# Patient Record
Sex: Male | Born: 1970 | Race: White | Hispanic: No | Marital: Married | Smoking: Former smoker
Health system: Southern US, Community
[De-identification: ages and names within clinical notes are randomized; demographics above are authoritative.]

## PROBLEM LIST (undated history)

## (undated) DIAGNOSIS — M1990 Unspecified osteoarthritis, unspecified site: Secondary | ICD-10-CM

## (undated) DIAGNOSIS — M549 Dorsalgia, unspecified: Secondary | ICD-10-CM

## (undated) DIAGNOSIS — K219 Gastro-esophageal reflux disease without esophagitis: Secondary | ICD-10-CM

## (undated) DIAGNOSIS — E7800 Pure hypercholesterolemia, unspecified: Secondary | ICD-10-CM

## (undated) HISTORY — PX: EYE SURGERY: SHX253

---

## 2012-04-17 ENCOUNTER — Emergency Department (HOSPITAL_BASED_OUTPATIENT_CLINIC_OR_DEPARTMENT_OTHER)

## 2012-04-17 ENCOUNTER — Emergency Department (HOSPITAL_BASED_OUTPATIENT_CLINIC_OR_DEPARTMENT_OTHER)
Admission: EM | Admit: 2012-04-17 | Discharge: 2012-04-17 | Disposition: A | Discharge status: Home / Self Care | Attending: Emergency Medicine | Admitting: Emergency Medicine

## 2012-04-17 ENCOUNTER — Encounter (HOSPITAL_BASED_OUTPATIENT_CLINIC_OR_DEPARTMENT_OTHER): Payer: Self-pay | Admitting: *Deleted

## 2012-04-17 DIAGNOSIS — R079 Chest pain, unspecified: Secondary | ICD-10-CM | POA: Insufficient documentation

## 2012-04-17 DIAGNOSIS — Z8739 Personal history of other diseases of the musculoskeletal system and connective tissue: Secondary | ICD-10-CM | POA: Insufficient documentation

## 2012-04-17 DIAGNOSIS — K219 Gastro-esophageal reflux disease without esophagitis: Secondary | ICD-10-CM | POA: Insufficient documentation

## 2012-04-17 DIAGNOSIS — R1310 Dysphagia, unspecified: Secondary | ICD-10-CM

## 2012-04-17 HISTORY — DX: Unspecified osteoarthritis, unspecified site: M19.90

## 2012-04-17 HISTORY — DX: Dorsalgia, unspecified: M54.9

## 2012-04-17 HISTORY — DX: Gastro-esophageal reflux disease without esophagitis: K21.9

## 2012-04-17 LAB — COMPREHENSIVE METABOLIC PANEL
Albumin: 3.8 g/dL (ref 3.5–5.2)
Alkaline Phosphatase: 45 U/L (ref 39–117)
BUN: 13 mg/dL (ref 6–23)
Potassium: 3.6 mEq/L (ref 3.5–5.1)
Total Protein: 7.1 g/dL (ref 6.0–8.3)

## 2012-04-17 LAB — CBC WITH DIFFERENTIAL/PLATELET
Basophils Relative: 1 % (ref 0–1)
Eosinophils Absolute: 0.2 10*3/uL (ref 0.0–0.7)
Hemoglobin: 14.2 g/dL (ref 13.0–17.0)
MCH: 32 pg (ref 26.0–34.0)
MCHC: 36.2 g/dL — ABNORMAL HIGH (ref 30.0–36.0)
Monocytes Relative: 10 % (ref 3–12)
Neutrophils Relative %: 46 % (ref 43–77)
Platelets: 201 10*3/uL (ref 150–400)

## 2012-04-17 LAB — TROPONIN I: Troponin I: <0.3 ng/mL (ref <0.30)

## 2012-04-17 MED ORDER — GI COCKTAIL ~~LOC~~
30.0000 mL | Freq: Once | ORAL | Status: AC
  Administered 2012-04-17: 30 mL via ORAL
  Filled 2012-04-17: qty 30

## 2012-04-17 MED ORDER — PANTOPRAZOLE SODIUM 20 MG PO TBEC: 20.0000 mg | DELAYED_RELEASE_TABLET | Freq: Two times a day (BID) | ORAL | Status: DC

## 2012-04-17 NOTE — ED Notes (Signed)
Pt reports chest pain since yesterday morning- states pain is pressure in mid-chest- worse with swallowing

## 2012-04-17 NOTE — ED Provider Notes (Signed)
History     CSN: [id number]  Arrival date & time 04/17/12  2146   First MD Initiated Contact with Patient 04/17/12 2255      Chief Complaint  Patient presents with  . Chest Pain    (Consider location/radiation/quality/duration/timing/severity/associated sxs/prior treatment) Patient is a 41 y.o. male presenting with chest pain. The history is provided by the patient.  Chest Pain The chest pain began 2 days ago. Chest pain occurs intermittently. The chest pain is worsening. Associated with: eating. The severity of the pain is moderate. The quality of the pain is described as burning. The pain does not radiate. Chest pain is worsened by eating. Primary symptoms include nausea. Pertinent negatives for primary symptoms include no fever, no shortness of breath, no cough and no palpitations.     Past Medical History  Diagnosis Date  . Arthritis   . Acid reflux   . Back pain     Past Surgical History  Procedure Date  . Eye surgery     No family history on file.  History  Substance Use Topics  . Smoking status: Former Games developer  . Smokeless tobacco: Never Used  . Alcohol Use: Not on file      Review of Systems  Constitutional: Negative for fever.  Respiratory: Negative for cough and shortness of breath.   Cardiovascular: Positive for chest pain. Negative for palpitations.  Gastrointestinal: Positive for nausea.  All other systems reviewed and are negative.    Allergies  Review of patient's allergies indicates no known allergies.  Home Medications   Current Outpatient Rx  Name Route Sig Dispense Refill  . DOXYCYCLINE HYCLATE PO Oral Take 1 tablet by mouth daily. For tooth pain.    . MOMETASONE FUROATE 220 MCG/INH IN AEPB Inhalation Inhale 2 puffs into the lungs daily.    Marland Kitchen OMEPRAZOLE 20 MG PO CPDR Oral Take 20 mg by mouth daily.    Marland Kitchen PRESCRIPTION MEDICATION Oral Take 1 tablet by mouth 3 (three) times daily. Muscle relaxer.      BP 127/84  Pulse 77  Temp 98.1 F  (36.7 C) (Oral)  Resp 19  SpO2 100%  Physical Exam  Nursing note and vitals reviewed. Constitutional: He is oriented to person, place, and time. He appears well-developed and well-nourished. No distress.  HENT:  Head: Normocephalic and atraumatic.  Mouth/Throat: Oropharynx is clear and moist.  Neck: Normal range of motion. Neck supple.  Cardiovascular: Normal rate and regular rhythm.   No murmur heard. Pulmonary/Chest: Breath sounds normal. No respiratory distress. He has no wheezes.  Abdominal: Soft. Bowel sounds are normal. He exhibits no distension. There is no tenderness.  Musculoskeletal: Normal range of motion. He exhibits no edema.  Neurological: He is alert and oriented to person, place, and time.  Skin: Skin is warm and dry. He is not diaphoretic.    ED Course  Procedures (including critical care time)  Labs Reviewed  CBC WITH DIFFERENTIAL - Abnormal; Notable for the following:    MCHC 36.2 (*)     All other components within normal limits  COMPREHENSIVE METABOLIC PANEL - Abnormal; Notable for the following:    Glucose, Bld 102 (*)     AST 43 (*)     ALT 91 (*)     GFR calc non Af Amer 82 (*)     All other components within normal limits  TROPONIN I   Dg Chest 2 View  04/17/2012  *RADIOLOGY REPORT*  Clinical Data: Palpitations and anxiety  CHEST -  2 VIEW  Comparison:  None.  Findings:  The heart size and mediastinal contours are within normal limits.  Both lungs are clear.  The visualized skeletal structures are unremarkable.  IMPRESSION: No active cardiopulmonary disease.   Original Report Authenticated By: Camelia Phenes, M.D.      No diagnosis found.   Date: 04/17/2012  Rate: 78  Rhythm: normal sinus rhythm  QRS Axis: normal  Intervals: normal  ST/T Wave abnormalities: normal  Conduction Disutrbances:none  Narrative Interpretation:   Old EKG Reviewed: none available    MDM  The patient presents here with symptoms that sound gi in nature.  He is  feeling better with the gi cocktail.  There is nothing in the history, exam, or workup to suggest a cardiac etiology.  He is on prilosec and I will change this to protonix and see how he responds to this.  He should follow up with his pcp in the upcoming week or two to discuss further testing, for example endoscopy, at his discretion.          Geoffery Lyons, MD 04/17/12 [id number]

## 2013-09-12 ENCOUNTER — Encounter (HOSPITAL_COMMUNITY): Payer: Self-pay | Admitting: Emergency Medicine

## 2013-09-12 ENCOUNTER — Emergency Department (HOSPITAL_COMMUNITY)
Admission: EM | Admit: 2013-09-12 | Discharge: 2013-09-12 | Disposition: A | Discharge status: Home / Self Care | Attending: Emergency Medicine | Admitting: Emergency Medicine

## 2013-09-12 DIAGNOSIS — K2970 Gastritis, unspecified, without bleeding: Secondary | ICD-10-CM

## 2013-09-12 DIAGNOSIS — Z8739 Personal history of other diseases of the musculoskeletal system and connective tissue: Secondary | ICD-10-CM | POA: Insufficient documentation

## 2013-09-12 DIAGNOSIS — Z8639 Personal history of other endocrine, nutritional and metabolic disease: Secondary | ICD-10-CM | POA: Insufficient documentation

## 2013-09-12 DIAGNOSIS — Z862 Personal history of diseases of the blood and blood-forming organs and certain disorders involving the immune mechanism: Secondary | ICD-10-CM | POA: Insufficient documentation

## 2013-09-12 DIAGNOSIS — R197 Diarrhea, unspecified: Secondary | ICD-10-CM

## 2013-09-12 DIAGNOSIS — Z87891 Personal history of nicotine dependence: Secondary | ICD-10-CM | POA: Insufficient documentation

## 2013-09-12 DIAGNOSIS — Z79899 Other long term (current) drug therapy: Secondary | ICD-10-CM | POA: Insufficient documentation

## 2013-09-12 DIAGNOSIS — R000 Tachycardia, unspecified: Secondary | ICD-10-CM | POA: Insufficient documentation

## 2013-09-12 DIAGNOSIS — K219 Gastro-esophageal reflux disease without esophagitis: Secondary | ICD-10-CM | POA: Insufficient documentation

## 2013-09-12 DIAGNOSIS — IMO0002 Reserved for concepts with insufficient information to code with codable children: Secondary | ICD-10-CM | POA: Insufficient documentation

## 2013-09-12 DIAGNOSIS — K2990 Gastroduodenitis, unspecified, without bleeding: Principal | ICD-10-CM

## 2013-09-12 HISTORY — DX: Pure hypercholesterolemia, unspecified: E78.00

## 2013-09-12 LAB — CBC WITH DIFFERENTIAL/PLATELET
Basophils Absolute: 0 K/uL (ref 0.0–0.1)
Basophils Relative: 0 % (ref 0–1)
Eosinophils Absolute: 0 K/uL (ref 0.0–0.7)
Eosinophils Relative: 0 % (ref 0–5)
HCT: 50.4 % (ref 39.0–52.0)
Hemoglobin: 18.7 g/dL — ABNORMAL HIGH (ref 13.0–17.0)
Lymphocytes Relative: 7 % — ABNORMAL LOW (ref 12–46)
Lymphs Abs: 0.8 K/uL (ref 0.7–4.0)
MCH: 32.4 pg (ref 26.0–34.0)
MCHC: 37 g/dL — ABNORMAL HIGH (ref 30.0–36.0)
MCV: 87.3 fL (ref 78.0–100.0)
Monocytes Absolute: 0.5 K/uL (ref 0.1–1.0)
Monocytes Relative: 4 % (ref 3–12)
Neutro Abs: 9.2 K/uL — ABNORMAL HIGH (ref 1.7–7.7)
Neutrophils Relative %: 88 % — ABNORMAL HIGH (ref 43–77)
Platelets: 241 K/uL (ref 150–400)
RBC: 5.77 MIL/uL (ref 4.22–5.81)
RDW: 12.5 % (ref 11.5–15.5)
WBC: 10.5 K/uL (ref 4.0–10.5)

## 2013-09-12 LAB — COMPREHENSIVE METABOLIC PANEL WITH GFR
ALT: 112 U/L — ABNORMAL HIGH (ref 0–53)
AST: 36 U/L (ref 0–37)
Albumin: 4.5 g/dL (ref 3.5–5.2)
Alkaline Phosphatase: 48 U/L (ref 39–117)
BUN: 18 mg/dL (ref 6–23)
CO2: 21 meq/L (ref 19–32)
Calcium: 11.3 mg/dL — ABNORMAL HIGH (ref 8.4–10.5)
Chloride: 97 meq/L (ref 96–112)
Creatinine, Ser: 0.96 mg/dL (ref 0.50–1.35)
GFR calc Af Amer: >90 mL/min
GFR calc non Af Amer: >90 mL/min
Glucose, Bld: 132 mg/dL — ABNORMAL HIGH (ref 70–99)
Potassium: 3.9 meq/L (ref 3.7–5.3)
Sodium: 139 meq/L (ref 137–147)
Total Bilirubin: 0.7 mg/dL (ref 0.3–1.2)
Total Protein: 8.9 g/dL — ABNORMAL HIGH (ref 6.0–8.3)

## 2013-09-12 LAB — LIPASE, BLOOD: Lipase: 20 U/L (ref 11–59)

## 2013-09-12 MED ORDER — GI COCKTAIL ~~LOC~~
30.0000 mL | Freq: Once | ORAL | Status: AC
  Administered 2013-09-12: 30 mL via ORAL
  Filled 2013-09-12: qty 30

## 2013-09-12 MED ORDER — ONDANSETRON 4 MG PO TBDP: 4.0000 mg | ORAL_TABLET | Freq: Three times a day (TID) | ORAL | Status: DC | PRN

## 2013-09-12 MED ORDER — SODIUM CHLORIDE 0.9 % IV BOLUS (SEPSIS)
1000.0000 mL | Freq: Once | INTRAVENOUS | Status: AC
  Administered 2013-09-12: 1000 mL via INTRAVENOUS

## 2013-09-12 MED ORDER — PROMETHAZINE HCL 25 MG RE SUPP: 25.0000 mg | Freq: Four times a day (QID) | RECTAL | Status: DC | PRN

## 2013-09-12 MED ORDER — HYDROCODONE-ACETAMINOPHEN 5-325 MG PO TABS: 1.0000 | ORAL_TABLET | ORAL | Status: DC | PRN

## 2013-09-12 MED ORDER — MORPHINE SULFATE 4 MG/ML IJ SOLN
4.0000 mg | Freq: Once | INTRAMUSCULAR | Status: AC
  Administered 2013-09-12: 4 mg via INTRAVENOUS
  Filled 2013-09-12: qty 1

## 2013-09-12 MED ORDER — ONDANSETRON HCL 4 MG/2ML IJ SOLN
4.0000 mg | Freq: Once | INTRAMUSCULAR | Status: AC
  Administered 2013-09-12: 4 mg via INTRAVENOUS
  Filled 2013-09-12: qty 2

## 2013-09-12 NOTE — Discharge Instructions (Signed)
Call for a follow up appointment with a Family or Primary Care Provider.  °Return if Symptoms worsen.   °Take medication as prescribed.  ° °

## 2013-09-12 NOTE — ED Notes (Signed)
abd pain and nausea x 3 days and the n/v virus ahs been going around his house pt has had diarrhea

## 2013-09-12 NOTE — ED Notes (Signed)
ED PA at bedside

## 2013-09-12 NOTE — ED Provider Notes (Signed)
CSN: [id number]     Arrival date & time 09/12/13  1118 History   First MD Initiated Contact with Patient 09/12/13 1206     Chief Complaint  Patient presents with  . Nausea   (Consider location/radiation/quality/duration/timing/severity/associated sxs/prior Treatment) HPI Comments: Evan Barber is a 43 y.o. year-old male with a past medical history of GERD, back pain, presenting the Emergency Department with a chief complaint of abdominal pain since 2300 last night.  He reports 10/10 generalized abdominal pain. He reports associated nausea without emesis but reports foul after taste from belching and reflux symptoms. The patient report 10-11 episodes of watery diarrhea since 0100 today. He reports taking omeprazole, pepto bismol, and tums without relief.  No history of abdominal surgeries or colonoscopy.  No EtOH use.     The history is provided by the patient, medical records and a significant other. No language interpreter was used.    Past Medical History  Diagnosis Date  . Arthritis   . Acid reflux   . Back pain   . Acid reflux   . High cholesterol    Past Surgical History  Procedure Laterality Date  . Eye surgery     No family history on file. History  Substance Use Topics  . Smoking status: Former Games developermoker  . Smokeless tobacco: Never Used  . Alcohol Use: Not on file    Review of Systems  Constitutional: Negative for fever and chills.  Cardiovascular: Negative for chest pain and leg swelling.  Gastrointestinal: Positive for nausea, abdominal pain and diarrhea. Negative for vomiting, constipation, blood in stool, abdominal distention and anal bleeding.    Allergies  Review of patient's allergies indicates no known allergies.  Home Medications   Current Outpatient Rx  Name  Route  Sig  Dispense  Refill  . bismuth subsalicylate (PEPTO BISMOL) 262 MG/15ML suspension   Oral   Take 30 mLs by mouth every 6 (six) hours as needed for indigestion.         . calcium  carbonate (TUMS - DOSED IN MG ELEMENTAL CALCIUM) 500 MG chewable tablet   Oral   Chew 3 tablets by mouth daily.         . citalopram (CELEXA) 20 MG tablet   Oral   Take 20 mg by mouth daily.         . mometasone (ASMANEX) 220 MCG/INH inhaler   Inhalation   Inhale 2 puffs into the lungs daily.         Marland Kitchen. omeprazole (PRILOSEC) 20 MG capsule   Oral   Take 20 mg by mouth daily.         Marland Kitchen. EXPIRED: pantoprazole (PROTONIX) 20 MG tablet   Oral   Take 1 tablet (20 mg total) by mouth 2 (two) times daily.   30 tablet   1    BP 124/62  Pulse 91  Temp(Src) 97.6 F (36.4 C)  Resp 16  SpO2 99% Physical Exam  Nursing note and vitals reviewed. Constitutional: He is oriented to person, place, and time. He appears well-developed and well-nourished. No distress.  HENT:  Head: Normocephalic and atraumatic.  Eyes: No scleral icterus.  Neck: Neck supple.  Cardiovascular: Regular rhythm.  Tachycardia present.   Pulmonary/Chest: Effort normal. Not tachypneic. No respiratory distress. He has no decreased breath sounds. He has no wheezes. He has no rhonchi. He has no rales.  Abdominal: Soft. Normal appearance and bowel sounds are normal. There is tenderness in the right upper quadrant, epigastric area, left  upper quadrant and left lower quadrant. There is no CVA tenderness, no tenderness at McBurney's point and negative Murphy's sign.  Neurological: He is alert and oriented to person, place, and time.    ED Course  Procedures (including critical care time) Labs Review Labs Reviewed  CBC WITH DIFFERENTIAL - Abnormal; Notable for the following:    Hemoglobin 18.7 (*)    MCHC 37.0 (*)    Neutrophils Relative % 88 (*)    Neutro Abs 9.2 (*)    Lymphocytes Relative 7 (*)    All other components within normal limits  COMPREHENSIVE METABOLIC PANEL - Abnormal; Notable for the following:    Glucose, Bld 132 (*)    Calcium 11.3 (*)    Total Protein 8.9 (*)    ALT 112 (*)    All other  components within normal limits  LIPASE, BLOOD   Imaging Review No results found.  EKG Interpretation   None       MDM   1. Gastritis   2. Diarrhea    Pt reports abrupt onset of abdominal pain last night with numerous episodes of diarrhea, several family members with a GI symptoms for several days.  Likely viral illness, labs, fluid, pain medication, nausea medication ordered. Lipase, negative. CBC WNL, CBC elevated Hgb. 1400: Patient sleeping in room reports pain is 4/10 and nausea symptoms have resolved.  1801: Re-eval patient reports symptom improvement, abdomen non-tender to palpation.  Discussed lab results, imaging results, and treatment plan with the patient and the patient's wife. Return precautions given. Reports understanding and no other concerns at this time.  Patient is stable for discharge at this time.  Meds given in ED:  Medications  sodium chloride 0.9 % bolus 1,000 mL (0 mLs Intravenous Stopped 09/12/13 1518)  ondansetron (ZOFRAN) injection 4 mg (4 mg Intravenous Given 09/12/13 1256)  morphine 4 MG/ML injection 4 mg (4 mg Intravenous Given 09/12/13 1256)  gi cocktail (Maalox,Lidocaine,Donnatal) (30 mLs Oral Given 09/12/13 1518)    Discharge Medication List as of 09/12/2013  4:07 PM    START taking these medications   Details  HYDROcodone-acetaminophen (NORCO/VICODIN) 5-325 MG per tablet Take 1 tablet by mouth every 4 (four) hours as needed. Take with food, Starting 09/12/2013, Until Discontinued, Print    ondansetron (ZOFRAN ODT) 4 MG disintegrating tablet Take 1 tablet (4 mg total) by mouth every 8 (eight) hours as needed for nausea or vomiting., Starting 09/12/2013, Until Discontinued, Print    promethazine (PHENERGAN) 25 MG suppository Place 1 suppository (25 mg total) rectally every 6 (six) hours as needed for nausea or vomiting., Starting 09/12/2013, Until Discontinued, Print          Clabe Seal, PA-C 09/15/13 1523

## 2013-09-19 NOTE — ED Provider Notes (Signed)
Medical screening examination/treatment/procedure(s) were performed by non-physician practitioner and as supervising physician I was immediately available for consultation/collaboration.  EKG Interpretation   None         Shelda JakesScott W. Amiel Mccaffrey, MD 09/19/13 1319

## 2016-11-02 ENCOUNTER — Emergency Department (HOSPITAL_COMMUNITY)

## 2016-11-02 ENCOUNTER — Encounter (HOSPITAL_COMMUNITY): Payer: Self-pay | Admitting: Emergency Medicine

## 2016-11-02 ENCOUNTER — Emergency Department (HOSPITAL_COMMUNITY)
Admission: EM | Admit: 2016-11-02 | Discharge: 2016-11-03 | Disposition: A | Discharge status: Home / Self Care | Attending: Emergency Medicine | Admitting: Emergency Medicine

## 2016-11-02 DIAGNOSIS — Z87891 Personal history of nicotine dependence: Secondary | ICD-10-CM | POA: Insufficient documentation

## 2016-11-02 DIAGNOSIS — R109 Unspecified abdominal pain: Secondary | ICD-10-CM | POA: Diagnosis present

## 2016-11-02 DIAGNOSIS — N23 Unspecified renal colic: Secondary | ICD-10-CM | POA: Diagnosis not present

## 2016-11-02 DIAGNOSIS — N201 Calculus of ureter: Secondary | ICD-10-CM | POA: Diagnosis not present

## 2016-11-02 DIAGNOSIS — N200 Calculus of kidney: Secondary | ICD-10-CM

## 2016-11-02 LAB — CBC
HEMATOCRIT: 44.4 % (ref 39.0–52.0)
HEMOGLOBIN: 15.4 g/dL (ref 13.0–17.0)
MCH: 31.2 pg (ref 26.0–34.0)
MCHC: 34.7 g/dL (ref 30.0–36.0)
MCV: 90.1 fL (ref 78.0–100.0)
Platelets: 257 10*3/uL (ref 150–400)
RBC: 4.93 MIL/uL (ref 4.22–5.81)
RDW: 12.7 % (ref 11.5–15.5)
WBC: 9.7 10*3/uL (ref 4.0–10.5)

## 2016-11-02 LAB — BASIC METABOLIC PANEL
Anion gap: 9 (ref 5–15)
BUN: 13 mg/dL (ref 6–20)
CHLORIDE: 100 mmol/L — AB (ref 101–111)
CO2: 28 mmol/L (ref 22–32)
Calcium: 9.1 mg/dL (ref 8.9–10.3)
Creatinine, Ser: 1.15 mg/dL (ref 0.61–1.24)
GFR calc Af Amer: >60 mL/min (ref >60)
GFR calc non Af Amer: >60 mL/min (ref >60)
GLUCOSE: 106 mg/dL — AB (ref 65–99)
POTASSIUM: 4 mmol/L (ref 3.5–5.1)
Sodium: 137 mmol/L (ref 135–145)

## 2016-11-02 LAB — URINALYSIS, ROUTINE W REFLEX MICROSCOPIC
Bilirubin Urine: NEGATIVE
Glucose, UA: NEGATIVE mg/dL
HGB URINE DIPSTICK: NEGATIVE
Ketones, ur: NEGATIVE mg/dL
LEUKOCYTES UA: NEGATIVE
Nitrite: NEGATIVE
Protein, ur: NEGATIVE mg/dL
SPECIFIC GRAVITY, URINE: 1.026 (ref 1.005–1.030)
pH: 5 (ref 5.0–8.0)

## 2016-11-02 MED ORDER — HYDROMORPHONE HCL 2 MG/ML IJ SOLN
1.0000 mg | Freq: Once | INTRAMUSCULAR | Status: AC
  Administered 2016-11-02: 1 mg via INTRAVENOUS
  Filled 2016-11-02: qty 1

## 2016-11-02 MED ORDER — SODIUM CHLORIDE 0.9 % IV BOLUS (SEPSIS)
1000.0000 mL | Freq: Once | INTRAVENOUS | Status: AC
  Administered 2016-11-02: 1000 mL via INTRAVENOUS

## 2016-11-02 MED ORDER — OXYCODONE-ACETAMINOPHEN 5-325 MG PO TABS
1.0000 | ORAL_TABLET | ORAL | Status: DC | PRN
  Administered 2016-11-02: 1 via ORAL

## 2016-11-02 MED ORDER — NAPROXEN 375 MG PO TABS: 375.0000 mg | ORAL_TABLET | Freq: Two times a day (BID) | ORAL | 0 refills | Status: AC | PRN

## 2016-11-02 MED ORDER — ONDANSETRON HCL 4 MG/2ML IJ SOLN
4.0000 mg | Freq: Once | INTRAMUSCULAR | Status: AC
  Administered 2016-11-02: 4 mg via INTRAVENOUS
  Filled 2016-11-02: qty 2

## 2016-11-02 MED ORDER — OXYCODONE-ACETAMINOPHEN 5-325 MG PO TABS
2.0000 | ORAL_TABLET | Freq: Once | ORAL | Status: AC
  Administered 2016-11-03: 2 via ORAL
  Filled 2016-11-02: qty 2

## 2016-11-02 MED ORDER — OXYCODONE-ACETAMINOPHEN 5-325 MG PO TABS
ORAL_TABLET | ORAL | Status: AC
  Filled 2016-11-02: qty 1

## 2016-11-02 MED ORDER — ONDANSETRON 4 MG PO TBDP: 4.0000 mg | ORAL_TABLET | Freq: Three times a day (TID) | ORAL | 0 refills | Status: AC | PRN

## 2016-11-02 MED ORDER — OXYCODONE-ACETAMINOPHEN 5-325 MG PO TABS: 1.0000 | ORAL_TABLET | ORAL | 0 refills | Status: AC | PRN

## 2016-11-02 MED ORDER — KETOROLAC TROMETHAMINE 15 MG/ML IJ SOLN
15.0000 mg | Freq: Once | INTRAMUSCULAR | Status: AC
  Administered 2016-11-02: 15 mg via INTRAVENOUS
  Filled 2016-11-02: qty 1

## 2016-11-02 MED ORDER — TAMSULOSIN HCL 0.4 MG PO CAPS: 0.4000 mg | ORAL_CAPSULE | Freq: Every day | ORAL | 0 refills | Status: AC

## 2016-11-02 NOTE — ED Notes (Signed)
Pt returned from imaging.

## 2016-11-02 NOTE — ED Notes (Signed)
Evan Barber - Wife - [phone]

## 2016-11-02 NOTE — ED Triage Notes (Signed)
Pt presents with L flank pain that began 30 min PTA; pt states the pain radiates into the groin area; pt denies urinary symptoms; pt appears very uncomfortable; denies hx of kidney stones

## 2016-11-02 NOTE — ED Provider Notes (Signed)
[facility]-EMERGENCY DEPT Provider Note   CSN: [id number] Arrival date & time: 11/02/16  2103     History   Chief Complaint Chief Complaint  Patient presents with  . Flank Pain    L    HPI Evan Barber is a 46 y.o. male.  HPI 46 yo M with PMHx as below here with acute onset left flank pain. Pt's sx started acutely 2 hours ago. He describes acute onset of progressively worsening severe aching, gnawing, left flank pain with radiation to his groin. Denies any testicular pain or swelling. His urine has appeared darker but denies any overt blood. He has had associated nausea but no vomiting. No fevers. No h/o kidney stones. No dysuria or frequency. No preceding sx. No recent falls or trauma.   Past Medical History:  Diagnosis Date  . Acid reflux   . Acid reflux   . Arthritis   . Back pain   . High cholesterol     There are no active problems to display for this patient.   Past Surgical History:  Procedure Laterality Date  . EYE SURGERY         Home Medications    Prior to Admission medications   Medication Sig Start Date End Date Taking? Authorizing Provider  albuterol (PROVENTIL HFA;VENTOLIN HFA) 108 (90 Base) MCG/ACT inhaler Inhale 2 puffs into the lungs every 6 (six) hours as needed for wheezing or shortness of breath.   Yes Historical Provider, MD  budesonide-formoterol (SYMBICORT) 80-4.5 MCG/ACT inhaler Inhale 2 puffs into the lungs 2 (two) times daily.   Yes Historical Provider, MD  FLUoxetine (PROZAC) 40 MG capsule Take 40 mg by mouth daily.   Yes Historical Provider, MD  omeprazole (PRILOSEC) 20 MG capsule Take 20 mg by mouth daily.   Yes Historical Provider, MD  rOPINIRole (REQUIP) 0.5 MG tablet Take 0.5 mg by mouth at bedtime.   Yes Historical Provider, MD    Family History History reviewed. No pertinent family history.  Social History Social History  Substance Use Topics  . Smoking status: Former Games developer  . Smokeless tobacco: Never Used  . Alcohol use Not  on file     Allergies   Patient has no known allergies.   Review of Systems Review of Systems  Constitutional: Positive for fatigue. Negative for chills and fever.  HENT: Negative for congestion and rhinorrhea.   Eyes: Negative for visual disturbance.  Respiratory: Negative for cough, shortness of breath and wheezing.   Cardiovascular: Negative for chest pain and leg swelling.  Gastrointestinal: Positive for nausea. Negative for abdominal pain, diarrhea and vomiting.  Genitourinary: Positive for flank pain. Negative for dysuria, penile swelling, scrotal swelling and testicular pain.  Musculoskeletal: Negative for neck pain and neck stiffness.  Skin: Negative for rash and wound.  Allergic/Immunologic: Negative for immunocompromised state.  Neurological: Negative for syncope, weakness and headaches.  All other systems reviewed and are negative.    Physical Exam Updated Vital Signs BP 130/85   Pulse 84   Temp 97.5 F (36.4 C) (Oral)   Resp 22   Ht 5\' 7"  (1.702 m)   Wt 185 lb (83.9 kg)   SpO2 99%   BMI 28.98 kg/m   Physical Exam  Constitutional: He is oriented to person, place, and time. He appears well-developed and well-nourished. He appears distressed.  HENT:  Head: Normocephalic and atraumatic.  Eyes: Conjunctivae are normal.  Neck: Neck supple.  Cardiovascular: Normal rate, regular rhythm and normal heart sounds.  Exam reveals no  friction rub.   No murmur heard. Pulmonary/Chest: Effort normal and breath sounds normal. No respiratory distress. He has no wheezes. He has no rales.  Abdominal: Soft. Bowel sounds are normal. He exhibits no distension. There is no tenderness.  No CVAT bilaterally  Musculoskeletal: He exhibits no edema.  Neurological: He is alert and oriented to person, place, and time. He exhibits normal muscle tone.  Skin: Skin is warm. Capillary refill takes less than 2 seconds.  Psychiatric: He has a normal mood and affect.  Nursing note and vitals  reviewed.    ED Treatments / Results  Labs (all labs ordered are listed, but only abnormal results are displayed) Labs Reviewed  BASIC METABOLIC PANEL - Abnormal; Notable for the following:       Result Value   Chloride 100 (*)    Glucose, Bld 106 (*)    All other components within normal limits  URINALYSIS, ROUTINE W REFLEX MICROSCOPIC  CBC    EKG  EKG Interpretation None       Radiology Ct Renal Stone Study  Result Date: 11/02/2016 CLINICAL DATA:  Left-sided flank pain EXAM: CT ABDOMEN AND PELVIS WITHOUT CONTRAST TECHNIQUE: Multidetector CT imaging of the abdomen and pelvis was performed following the standard protocol without IV contrast. COMPARISON:  None. FINDINGS: Lower chest: No acute abnormality. Hepatobiliary: No focal liver abnormality is seen. No gallstones, gallbladder wall thickening, or biliary dilatation. Pancreas: Unremarkable. No pancreatic ductal dilatation or surrounding inflammatory changes. Spleen: Normal in size without focal abnormality. Adrenals/Urinary Tract: Adrenal glands are within normal limits. No calcified stones or hydronephrosis on the right. Mild left hydronephrosis and hydroureter, secondary to a 3 mm stone at the left UVJ. Bladder otherwise normal. Stomach/Bowel: Stomach is within normal limits. Appendix appears normal. No evidence of bowel wall thickening, distention, or inflammatory changes. Vascular/Lymphatic: No significant vascular findings are present. No enlarged abdominal or pelvic lymph nodes. Reproductive: Prostate is unremarkable. Other: No abdominal wall hernia or abnormality. No abdominopelvic ascites. Musculoskeletal: No acute or significant osseous findings. IMPRESSION: Mild left hydronephrosis and hydroureter secondary to a 3 mm stone at the left UVJ Electronically Signed   By: Jasmine Pang M.D.   On: 11/02/2016 23:25    Procedures Procedures (including critical care time)  Medications Ordered in ED Medications    oxyCODONE-acetaminophen (PERCOCET/ROXICET) 5-325 MG per tablet 1 tablet (1 tablet Oral Given 11/02/16 2115)  oxyCODONE-acetaminophen (PERCOCET/ROXICET) 5-325 MG per tablet (not administered)  oxyCODONE-acetaminophen (PERCOCET/ROXICET) 5-325 MG per tablet 2 tablet (not administered)  HYDROmorphone (DILAUDID) injection 1 mg (1 mg Intravenous Given 11/02/16 2156)  ondansetron (ZOFRAN) injection 4 mg (4 mg Intravenous Given 11/02/16 2201)  sodium chloride 0.9 % bolus 1,000 mL (0 mLs Intravenous Stopped 11/02/16 2331)  ketorolac (TORADOL) 15 MG/ML injection 15 mg (15 mg Intravenous Given 11/02/16 2201)     Initial Impression / Assessment and Plan / ED Course  I have reviewed the triage vital signs and the nursing notes.  Pertinent labs & imaging results that were available during my care of the patient were reviewed by me and considered in my medical decision making (see chart for details).     46 yo M here with severe L flank pain and nausea. No fevers. On arrival, VSS. Exam as above. CBC without leukocytosis. BMP with normal renal fxn. UA without UTI. Suspect renal stone versus MSK pain. No testicular pain, swelling, or s/s torsion, eipdidymitis, or orchitis. Will check stone study.  Stone study shows 3 mm stone at UVJ. Pt  markedly improved s/p IVF, dilaudid, toradol. Tolerating PO. Will advise hydration, outpt f/u, and d/c home. No fever, vomiting, or signs of complication.  Final Clinical Impressions(s) / ED Diagnoses   Final diagnoses:  Renal colic  Nephrolithiasis  Flank pain    New Prescriptions New Prescriptions   No medications on file     Shaune Pollackameron Mishell Donalson, MD 11/02/16 2344

## 2018-11-20 IMAGING — CT CT RENAL STONE PROTOCOL
2 of 4 series · 16 of 46 positions shown, 18 images · non-contrast
Comparison: None.

CLINICAL DATA: Left-sided flank pain

EXAM:
CT ABDOMEN AND PELVIS WITHOUT CONTRAST
TECHNIQUE: Multidetector CT imaging of the abdomen and pelvis was performed
following the standard protocol without IV contrast.

[Series 3: a/p w/o 5mm · axial · non-contrast · 0.79mm/px · z∈[+854,+1279]mm · 13 of 93 slices shown, 15 images]
[im 4/93  soft-tissue]
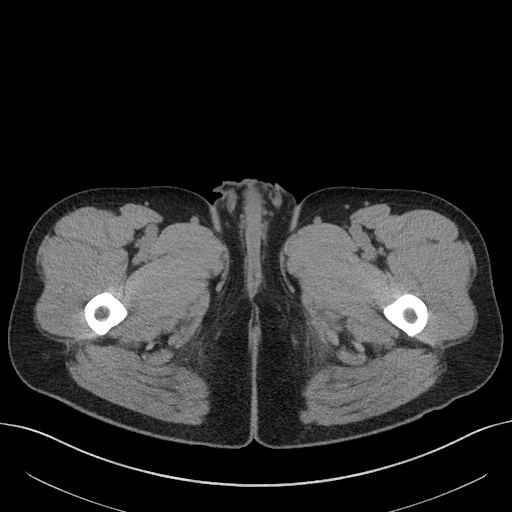
[im 4/93  bone]
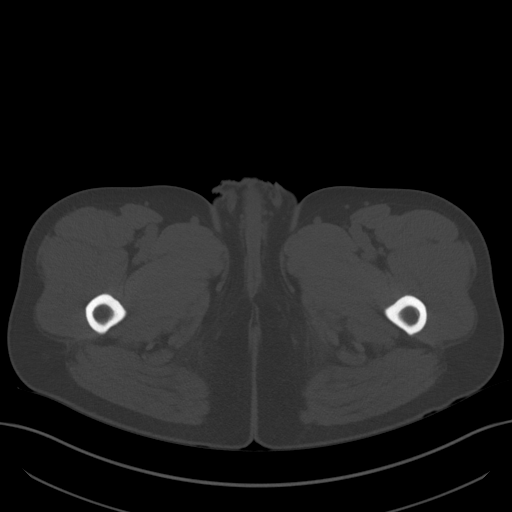
[im 12/93  soft-tissue]
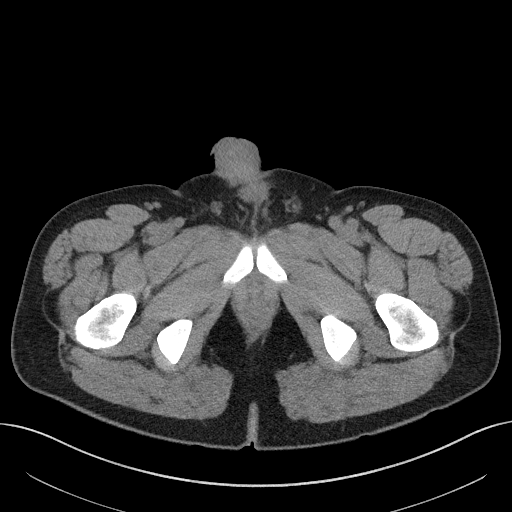
[im 19/93  soft-tissue]
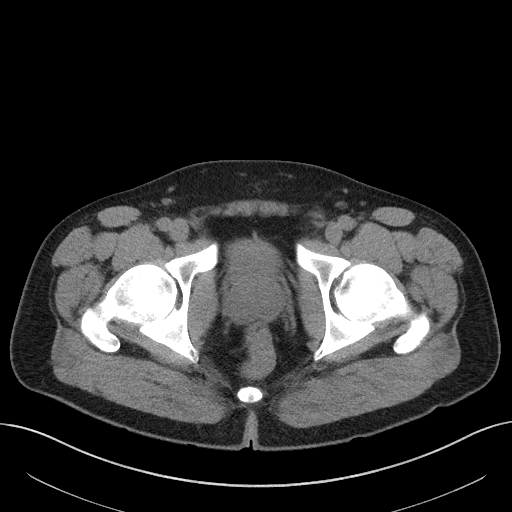
[im 26/93  soft-tissue]
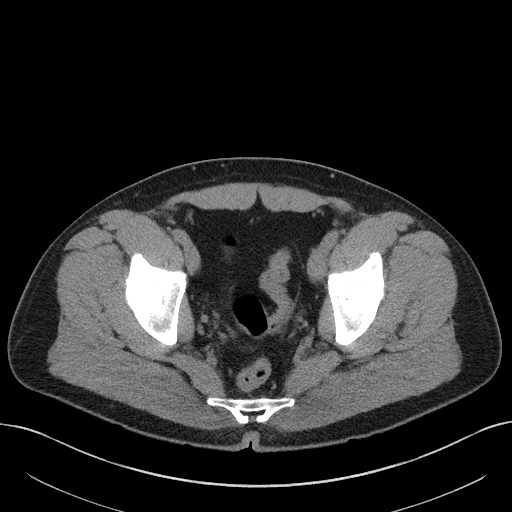
[im 34/93  soft-tissue]
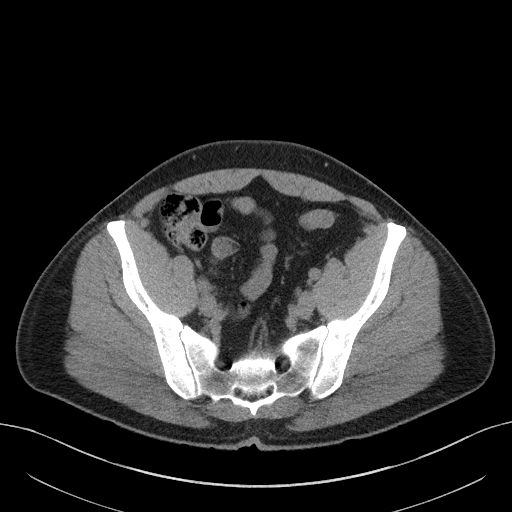
[im 41/93  soft-tissue]
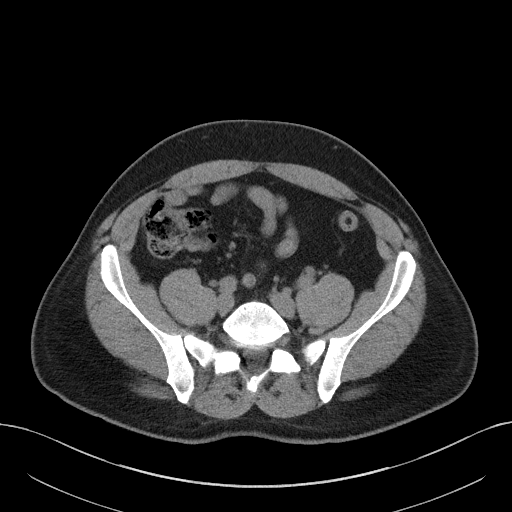
[im 48/93  soft-tissue]
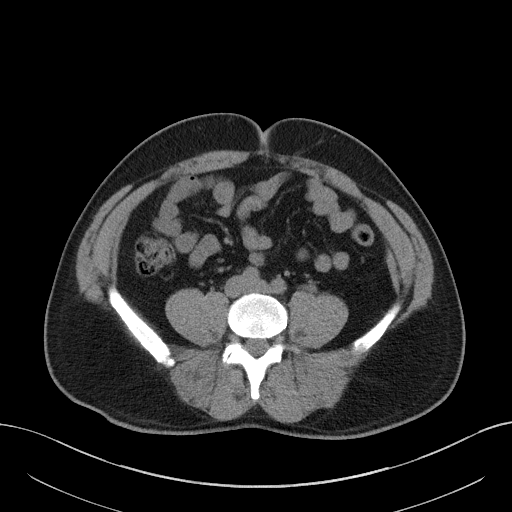
[im 52/93  soft-tissue]
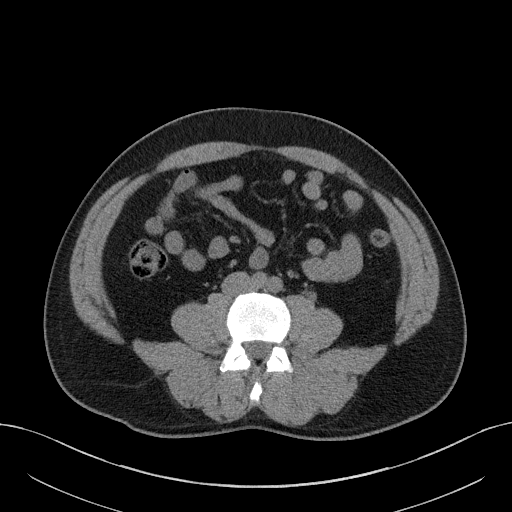
[im 59/93  soft-tissue]
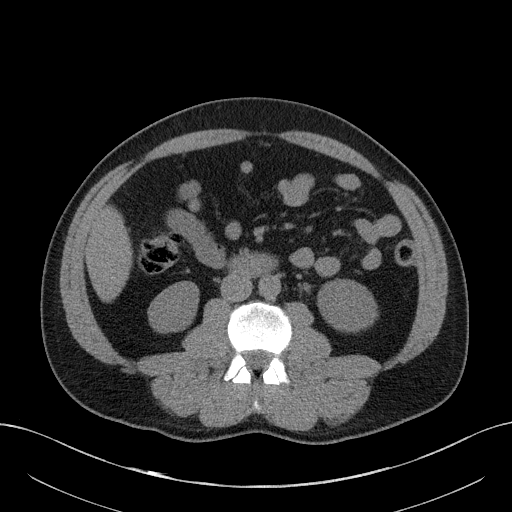
[im 59/93  bone]
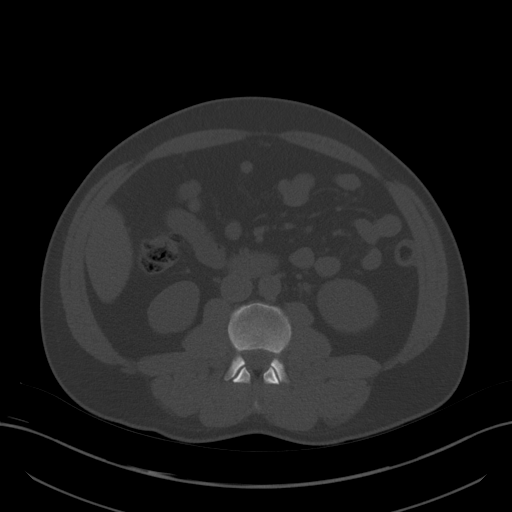
[im 67/93  soft-tissue]
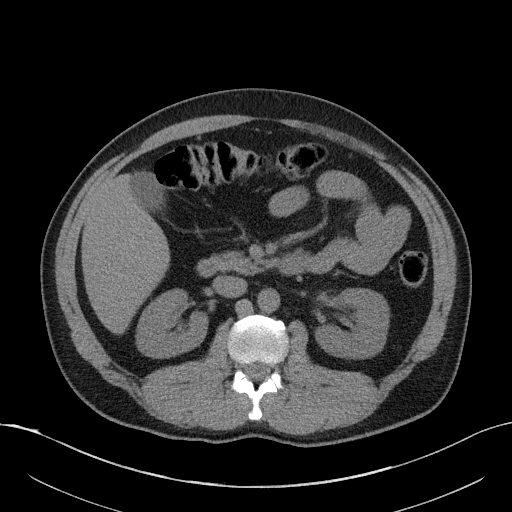
[im 74/93  soft-tissue]
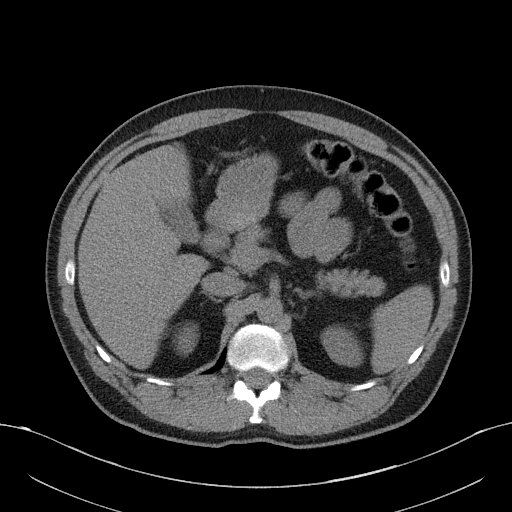
[im 81/93  soft-tissue]
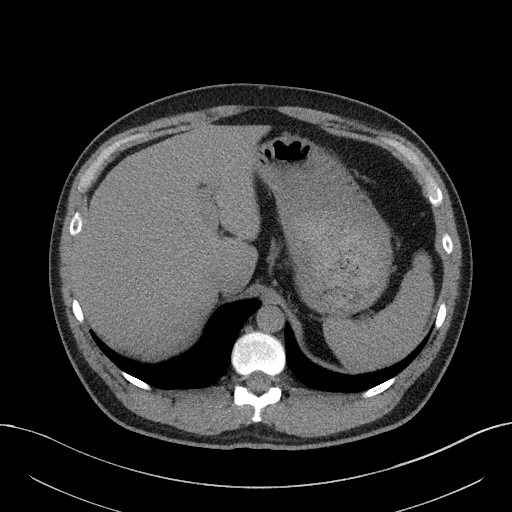
[im 89/93  soft-tissue]
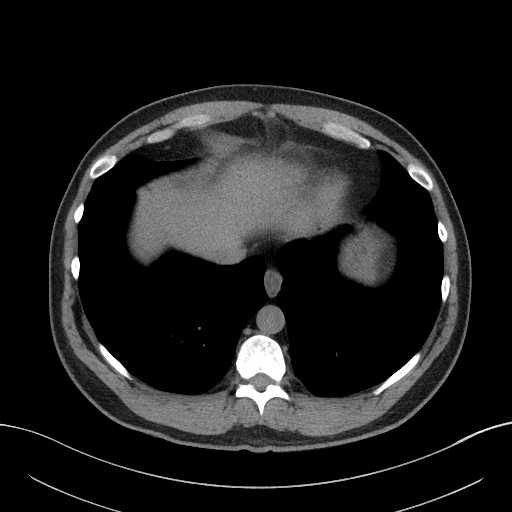

[Series 6: a/p w/o cor · coronal · non-contrast · 0.74mm/px · 3 of 151 slices shown]
[im 51/151  soft-tissue]
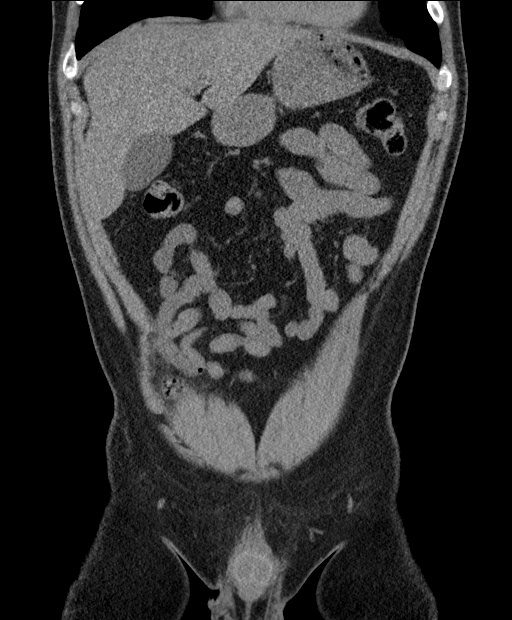
[im 67/151  soft-tissue]
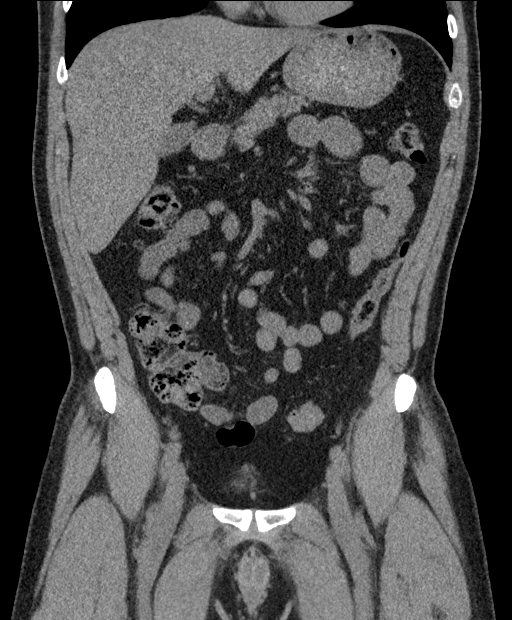
[im 84/151  soft-tissue]
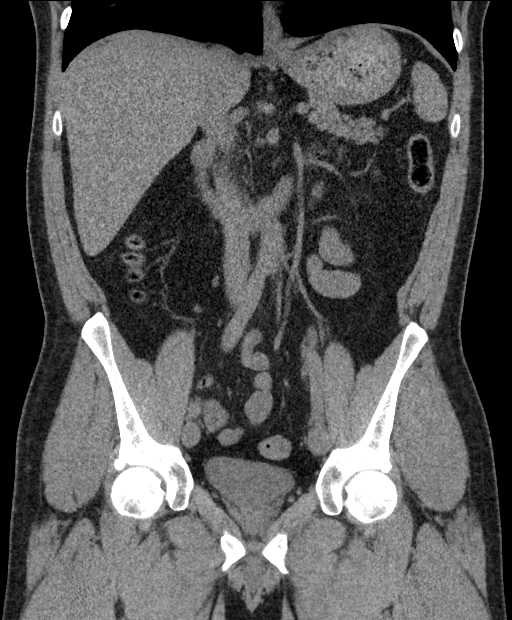

[16 of 46 positions shown; findings below may reference images not displayed]

FINDINGS: Lower chest: No acute abnormality.

Hepatobiliary: No focal liver abnormality is seen. No gallstones,
gallbladder wall thickening, or biliary dilatation.

Pancreas: Unremarkable. No pancreatic ductal dilatation or
surrounding inflammatory changes.

Spleen: Normal in size without focal abnormality.

Adrenals/Urinary Tract: Adrenal glands are within normal limits. No
calcified stones or hydronephrosis on the right.

Mild left hydronephrosis and hydroureter, secondary to a 3 mm stone
at the left UVJ. Bladder otherwise normal.

Stomach/Bowel: Stomach is within normal limits. Appendix appears
normal. No evidence of bowel wall thickening, distention, or
inflammatory changes.

Vascular/Lymphatic: No significant vascular findings are present. No
enlarged abdominal or pelvic lymph nodes.

Reproductive: Prostate is unremarkable.

Other: No abdominal wall hernia or abnormality. No abdominopelvic
ascites.

Musculoskeletal: No acute or significant osseous findings.
IMPRESSION: Mild left hydronephrosis and hydroureter secondary to a 3 mm stone
at the left UVJ

## 2021-02-05 ENCOUNTER — Encounter: Payer: Self-pay | Admitting: Emergency Medicine

## 2021-02-05 ENCOUNTER — Ambulatory Visit
Admission: EM | Admit: 2021-02-05 | Discharge: 2021-02-05 | Disposition: A | Discharge status: Home / Self Care | Attending: Emergency Medicine | Admitting: Emergency Medicine

## 2021-02-05 ENCOUNTER — Other Ambulatory Visit: Payer: Self-pay

## 2021-02-05 DIAGNOSIS — M7989 Other specified soft tissue disorders: Secondary | ICD-10-CM

## 2021-02-05 DIAGNOSIS — S8011XA Contusion of right lower leg, initial encounter: Secondary | ICD-10-CM

## 2021-02-05 DIAGNOSIS — M79661 Pain in right lower leg: Secondary | ICD-10-CM

## 2021-02-05 MED ORDER — IBUPROFEN 800 MG PO TABS: 800.0000 mg | ORAL_TABLET | Freq: Three times a day (TID) | ORAL | 0 refills | Status: AC

## 2021-02-05 NOTE — ED Triage Notes (Signed)
Pt sts was pushing on hay bale last week and felt "pop" with pain in right lower leg; swelling and bruising noted

## 2021-02-05 NOTE — Discharge Instructions (Addendum)
Use anti-inflammatories for pain/swelling. You may take up to 800 mg Ibuprofen every 8 hours with food. You may supplement Ibuprofen with Tylenol [phone] mg every 8 hours.  Ice and elevate Gentle calf stretches ACE wrap for compression/ comfort Follow up with sports medicine

## 2021-02-05 NOTE — ED Provider Notes (Signed)
[facility]    CSN: [id number] Arrival date & time: 02/05/21  3151      History   Chief Complaint Chief Complaint  Patient presents with   Leg Pain    HPI [name] is a 50 y.o. male presenting today for evaluation of right leg pain.  Reports that he was pushing hay bales last week and felt a popping sensation in his right lower leg.  Has since developed swelling and bruising.  Reports pain and tightness in the belly of his calf, but bruising and discoloration has been noted to foot, ankle and lower leg.  Felt a popping sensation when incident occurred.  Overall he has had improvement in pain in his gait has been manageable and improving over the past week.  HPI  Past Medical History:  Diagnosis Date   Acid reflux    Acid reflux    Arthritis    Back pain    High cholesterol     There are no problems to display for this patient.   Past Surgical History:  Procedure Laterality Date   EYE SURGERY         Home Medications    Prior to Admission medications   Medication Sig Start Date End Date Taking? Authorizing Provider  ibuprofen (ADVIL) 800 MG tablet Take 1 tablet (800 mg total) by mouth 3 (three) times daily. 02/05/21  Yes Tywanda Rice C, PA-C  albuterol (PROVENTIL HFA;VENTOLIN HFA) 108 (90 Base) MCG/ACT inhaler Inhale 2 puffs into the lungs every 6 (six) hours as needed for wheezing or shortness of breath.    [provider]  budesonide-formoterol (SYMBICORT) 80-4.5 MCG/ACT inhaler Inhale 2 puffs into the lungs 2 (two) times daily.    [provider]  FLUoxetine (PROZAC) 40 MG capsule Take 40 mg by mouth daily.    [provider]  omeprazole (PRILOSEC) 20 MG capsule Take 20 mg by mouth daily.    [provider]  ondansetron (ZOFRAN ODT) 4 MG disintegrating tablet Take 1 tablet (4 mg total) by mouth every 8 (eight) hours as needed for nausea or vomiting. 11/02/16   Shaune Pollack, MD  oxyCODONE-acetaminophen  (PERCOCET/ROXICET) 5-325 MG tablet Take 1-2 tablets by mouth every 4 (four) hours as needed for severe pain. Patient not taking: Reported on 02/05/2021 11/02/16   Shaune Pollack, MD  rOPINIRole (REQUIP) 0.5 MG tablet Take 0.5 mg by mouth at bedtime.    [provider]    Family History Family History  Family history unknown: Yes    Social History Social History   Tobacco Use   Smoking status: Former    Pack years: 0.00   Smokeless tobacco: Never  Vaping Use   Vaping Use: Never used  Substance Use Topics   Drug use: No     Allergies   Patient has no known allergies.   Review of Systems Review of Systems  Constitutional:  Negative for fatigue and fever.  Eyes:  Negative for redness, itching and visual disturbance.  Respiratory:  Negative for shortness of breath.   Cardiovascular:  Negative for chest pain and leg swelling.  Gastrointestinal:  Negative for nausea and vomiting.  Musculoskeletal:  Positive for myalgias. Negative for arthralgias.  Skin:  Positive for color change. Negative for rash and wound.  Neurological:  Negative for dizziness, syncope, weakness, light-headedness and headaches.    Physical Exam Triage Vital Signs ED Triage Vitals  Enc Vitals Group     BP      Pulse  Resp      Temp      Temp src      SpO2      Weight      Height      Head Circumference      Peak Flow      Pain Score      Pain Loc      Pain Edu?      Excl. in GC?    No data found.  Updated Vital Signs BP 122/64 (BP Location: Left Arm)   Pulse 67   Temp 98 F (36.7 C) (Oral)   Resp 18   SpO2 96%   Visual Acuity Right Eye Distance:   Left Eye Distance:   Bilateral Distance:    Right Eye Near:   Left Eye Near:    Bilateral Near:     Physical Exam Vitals and nursing note reviewed.  Constitutional:      Appearance: He is well-developed.     Comments: No acute distress  HENT:     Head: Normocephalic and atraumatic.     Nose: Nose normal.  Eyes:      Conjunctiva/sclera: Conjunctivae normal.  Cardiovascular:     Rate and Rhythm: Normal rate.  Pulmonary:     Effort: Pulmonary effort is normal. No respiratory distress.  Abdominal:     General: There is no distension.  Musculoskeletal:        General: Normal range of motion.     Cervical back: Neck supple.     Comments: Right calf: Tenderness to palpation throughout belly of calf with associated swelling and firmness compared to left, pain diminishes as moving distally down the calf, nontender over Achilles, feels firm and intact, negative Thompson's; no popliteal tenderness or discoloration  Skin:    General: Skin is warm and dry.     Comments: Yellow discoloration noted to distal lower leg with ecchymosis/bruising noted along medial aspect of foot  Neurological:     Mental Status: He is alert and oriented to person, place, and time.     UC Treatments / Results  Labs (all labs ordered are listed, but only abnormal results are displayed) Labs Reviewed - No data to display  EKG   Radiology No results found.  Procedures Procedures (including critical care time)  Medications Ordered in UC Medications - No data to display  Initial Impression / Assessment and Plan / UC Course  I have reviewed the triage vital signs and the nursing notes.  Pertinent labs & imaging results that were available during my care of the patient were reviewed by me and considered in my medical decision making (see chart for details).     Suspect most likely gastrocnemius tear-bruising in lower leg/foot likely from gravity pulling distally from initial injury, lower suspicion of DVT as cause of symptoms given associated ecchymosis and known injury.  Applying Ace wrap for compression recommending anti-inflammatories gentle Stretches ice elevate and follow-up with sports medicine.  Discussed strict return precautions. Patient verbalized understanding and is agreeable with plan.  Final Clinical  Impressions(s) / UC Diagnoses   Final diagnoses:  Pain of right calf  Calf swelling  Traumatic ecchymosis of lower leg, right, initial encounter     Discharge Instructions      Use anti-inflammatories for pain/swelling. You may take up to 800 mg Ibuprofen every 8 hours with food. You may supplement Ibuprofen with Tylenol [phone] mg every 8 hours.  Ice and elevate Gentle calf stretches ACE wrap for compression/  comfort Follow up with sports medicine     ED Prescriptions     Medication Sig Dispense Auth. Provider   ibuprofen (ADVIL) 800 MG tablet Take 1 tablet (800 mg total) by mouth 3 (three) times daily. 21 tablet Marquise Wicke, [place] C, PA-C      PDMP not reviewed this encounter.   Eleanna Theilen, [place] C, PA-C 02/05/21 1024
